# Patient Record
Sex: Female | Born: 1985 | Race: White | Hispanic: No | Marital: Married | Smoking: Never smoker
Health system: Southern US, Community
[De-identification: ages and names within clinical notes are randomized; demographics above are authoritative.]

---

## 2021-05-01 ENCOUNTER — Emergency Department (HOSPITAL_BASED_OUTPATIENT_CLINIC_OR_DEPARTMENT_OTHER): Payer: BC Managed Care – PPO

## 2021-05-01 ENCOUNTER — Other Ambulatory Visit: Payer: Self-pay

## 2021-05-01 ENCOUNTER — Encounter (HOSPITAL_BASED_OUTPATIENT_CLINIC_OR_DEPARTMENT_OTHER): Payer: Self-pay

## 2021-05-01 ENCOUNTER — Emergency Department (HOSPITAL_BASED_OUTPATIENT_CLINIC_OR_DEPARTMENT_OTHER)
Admission: EM | Admit: 2021-05-01 | Discharge: 2021-05-01 | Disposition: A | Discharge status: Home / Self Care | Payer: BC Managed Care – PPO | Attending: Emergency Medicine | Admitting: Emergency Medicine

## 2021-05-01 DIAGNOSIS — R079 Chest pain, unspecified: Secondary | ICD-10-CM

## 2021-05-01 DIAGNOSIS — R072 Precordial pain: Secondary | ICD-10-CM | POA: Diagnosis present

## 2021-05-01 DIAGNOSIS — R011 Cardiac murmur, unspecified: Secondary | ICD-10-CM | POA: Insufficient documentation

## 2021-05-01 DIAGNOSIS — R200 Anesthesia of skin: Secondary | ICD-10-CM | POA: Diagnosis not present

## 2021-05-01 LAB — CBC
HCT: 43.4 % (ref 36.0–46.0)
Hemoglobin: 15.1 g/dL — ABNORMAL HIGH (ref 12.0–15.0)
MCH: 29.3 pg (ref 26.0–34.0)
MCHC: 34.8 g/dL (ref 30.0–36.0)
MCV: 84.3 fL (ref 80.0–100.0)
Platelets: 246 10*3/uL (ref 150–400)
RBC: 5.15 MIL/uL — ABNORMAL HIGH (ref 3.87–5.11)
RDW: 12.4 % (ref 11.5–15.5)
WBC: 4.9 10*3/uL (ref 4.0–10.5)
nRBC: 0 % (ref 0.0–0.2)

## 2021-05-01 LAB — TROPONIN I (HIGH SENSITIVITY)
Troponin I (High Sensitivity): <2 ng/L (ref <18)
Troponin I (High Sensitivity): <2 ng/L (ref <18)

## 2021-05-01 LAB — BASIC METABOLIC PANEL
Anion gap: 10 (ref 5–15)
BUN: 10 mg/dL (ref 6–20)
CO2: 24 mmol/L (ref 22–32)
Calcium: 9.4 mg/dL (ref 8.9–10.3)
Chloride: 105 mmol/L (ref 98–111)
Creatinine, Ser: 0.73 mg/dL (ref 0.44–1.00)
GFR, Estimated: >60 mL/min (ref >60)
Glucose, Bld: 84 mg/dL (ref 70–99)
Potassium: 3.7 mmol/L (ref 3.5–5.1)
Sodium: 139 mmol/L (ref 135–145)

## 2021-05-01 LAB — PREGNANCY, URINE: Preg Test, Ur: NEGATIVE

## 2021-05-01 NOTE — ED Triage Notes (Signed)
Pt arrives ambulatory to ED with c/o CP starting yesterday with radiation into her back reports she also felt that she was having some numbness in her face. States that she took Tums yesterday and the CP got better but she feels that her face is swollen. Also reports playing golf for the first time over the weekend and feeling that she may have pulled a muscle.  ?

## 2021-05-01 NOTE — Discharge Instructions (Addendum)
Would like for you to follow-up with cardiology.  You should receive a phone call from them in the coming days to set up an appointment.  Please return to the emergency department for any worsening symptoms. ?

## 2021-05-01 NOTE — ED Provider Notes (Signed)
?[facility] EMERGENCY DEPARTMENT ?Provider Note ? ? ?CSN: [id number] ?Arrival date & time: 05/01/21  1203 ? ?  ? ?History ?Chief Complaint  ?Patient presents with  ? Chest Pain  ? ? ?Evelyn Ross is a 36 y.o. female with no significant past medical history who presents to the emergency department today with substernal chest tightness that started yesterday.  Patient states she played golf for the first time ever Saturday and Sunday and had some soreness over the right scapular area.  She states that yesterday she was having pain that radiated from the central chest which she described as a squeezing sensation to the right shoulder where she was sore.  Then began radiating up into the neck.  She took 2 Tums and drink some water and it completely resolved.  Since then she has been complaining that her face is slightly swollen on the right side.  She also had some numbness during the episode which has since resolved. ? ? ?Chest Pain ? ?  ? ?Home Medications ?Prior to Admission medications   ?Not on File  ?   ? ?Allergies    ?Patient has no known allergies.   ? ?Review of Systems   ?Review of Systems  ?Cardiovascular:  Positive for chest pain.  ?All other systems reviewed and are negative. ? ?Physical Exam ?Updated Vital Signs ?BP 131/80   Pulse 66   Temp 98.4 ?F (36.9 ?C) (Oral)   Resp 17   Ht 5\' 2"  (1.575 m)   Wt 64.9 kg   LMP 05/01/2021   SpO2 100%   BMI 26.16 kg/m?  ?Physical Exam ?Vitals and nursing note reviewed.  ?Constitutional:   ?   General: She is not in acute distress. ?   Appearance: Normal appearance.  ?HENT:  ?   Head: Normocephalic and atraumatic.  ?Eyes:  ?   General:     ?   Right eye: No discharge.     ?   Left eye: No discharge.  ?Cardiovascular:  ?   Heart sounds: Murmur heard.  ?   Comments: Regular rate and rhythm.  S1/S2 are distinct without any evidence of murmur, rubs, or gallops.  Radial pulses are 2+ bilaterally.  Dorsalis pedis pulses are 2+ bilaterally.  No evidence  of pedal edema. ?Pulmonary:  ?   Comments: Clear to auscultation bilaterally.  Normal effort.  No respiratory distress.  No evidence of wheezes, rales, or rhonchi heard throughout. ?Chest:  ?   Comments: No palpation to the anterior chest wall.  There is point tenderness to palpation over the right lateral scapula. ?Abdominal:  ?   General: Abdomen is flat. Bowel sounds are normal. There is no distension.  ?   Tenderness: There is no abdominal tenderness. There is no guarding or rebound.  ?Musculoskeletal:     ?   General: Normal range of motion.  ?   Cervical back: Neck supple.  ?Skin: ?   General: Skin is warm and dry.  ?   Findings: No rash.  ?Neurological:  ?   General: No focal deficit present.  ?   Mental Status: She is alert.  ?Psychiatric:     ?   Mood and Affect: Mood normal.     ?   Behavior: Behavior normal.  ? ? ?ED Results / Procedures / Treatments   ?Labs ?(all labs ordered are listed, but only abnormal results are displayed) ?Labs Reviewed  ?CBC - Abnormal; Notable for the following components:  ?  Result Value  ? RBC 5.15 (*)   ? Hemoglobin 15.1 (*)   ? All other components within normal limits  ?BASIC METABOLIC PANEL  ?PREGNANCY, URINE  ?TROPONIN I (HIGH SENSITIVITY)  ?TROPONIN I (HIGH SENSITIVITY)  ? ? ?EKG ?EKG Interpretation ? ?Date/Time:  Tuesday May 01 2021 12:15:12 EDT ?Ventricular Rate:  74 ?PR Interval:  134 ?QRS Duration: 74 ?QT Interval:  360 ?QTC Calculation: 399 ?R Axis:   73 ?Text Interpretation: Normal sinus rhythm Anterior and inferior T wave changes Abnormal ECG No previous ECGs available Confirmed by Alona Bene [phone]) on 05/01/2021 3:14:49 PM ? ?Radiology ?DG Chest 2 View ? ?Result Date: 05/01/2021 ?CLINICAL DATA:  Chest pain EXAM: CHEST - 2 VIEW COMPARISON:  None. FINDINGS: The heart size and mediastinal contours are within normal limits. Both lungs are clear. The visualized skeletal structures are unremarkable. IMPRESSION: No acute abnormality of the lungs. Electronically  Signed   By: Jearld Lesch M.D.   On: 05/01/2021 12:43   ? ?Procedures ?Procedures  ? ? ?Medications Ordered in ED ?Medications - No data to display ? ?ED Course/ Medical Decision Making/ A&P ?  ?                        ?Medical Decision Making ?Amount and/or Complexity of Data Reviewed ?Labs: ordered. ?Radiology: ordered. ? ? ?This patient presents to the ED for concern of chest pain, this involves an extensive number of treatment options, and is a complaint that carries with it a high risk of complications and morbidity.  The differential diagnosis includes ACS, atypical chest pain.  I have a low suspicion for dysrhythmia.  Patient has normal vital signs and I have a low suspicion for aortic dissection at this time.  I doubt cardiac tamponade, pneumothorax, pneumonia. ? ? ?Co morbidities that complicate the patient evaluation ? ?None ? ? ?Additional history obtained: ? ?Additional history obtained from nursing note.  No external records to review ? ? ? ?Lab Tests: ? ?I Ordered, and personally interpreted labs.  The pertinent results include: CBC which did not show any signs of anemia or leukocytosis.  Pregnancy was negative.  BMP was normal.  Initial delta troponin were negative. ? ? ?Imaging Studies ordered: ? ?I ordered imaging studies including chest x-ray ?I independently visualized and interpreted imaging which showed no acute abnormalities ?I agree with the radiologist interpretation ? ? ?Cardiac Monitoring: ? ?The patient was maintained on a cardiac monitor.  I personally viewed and interpreted the cardiac monitored which showed an underlying rhythm of: Normal sinus rhythm ? ? ?Medicines ordered and prescription drug management: ? ?None ? ? ?Problem List / ED Course: ? ?Chest pain less likely to be ACS at this time.  Patient does have some abnormal T wave inversions on her EKG with no prior to compare to.  Unclear whether or not this is chronic.  She will likely need to follow-up with cardiology about this  in the next week.  I will place an ambulatory referral.  Notified patient of all lab results.  Strict return precautions were discussed with the patient.  She is safe for discharge. ?Patient has a heart murmur and discussed with the patient she has had this since childhood. ? ? ?Reevaluation: ? ?After the interventions noted above, I reevaluated the patient and found that they have :improved ? ? ?Social Determinants of Health: ? ?None ? ? ?Dispostion: ? ?After consideration of the diagnostic results and the patients response to  treatment, I feel that the patent would benefit from outpatient follow-up with cardiology.  Ambulatory referral placed. ? ?Final Clinical Impression(s) / ED Diagnoses ?Final diagnoses:  ?Chest pain, unspecified type  ? ? ?Rx / DC Orders ?ED Discharge Orders   ? ?      Ordered  ?  Ambulatory referral to Cardiology       ? 05/01/21 1526  ? ?  ?  ? ?  ? ? ?  ?Honor Loh [place], PA-C ?05/01/21 1531 ? ?  ?Maia Plan, MD ?05/04/21 2342 ? ?

## 2021-06-05 ENCOUNTER — Ambulatory Visit: Payer: BC Managed Care – PPO | Admitting: Cardiology

## 2023-04-13 IMAGING — CR DG CHEST 2V
2 series · 2 of 2 positions shown · non-contrast
Comparison: None.

CLINICAL DATA: Chest pain

EXAM:
CHEST - 2 VIEW

[w chest pa]
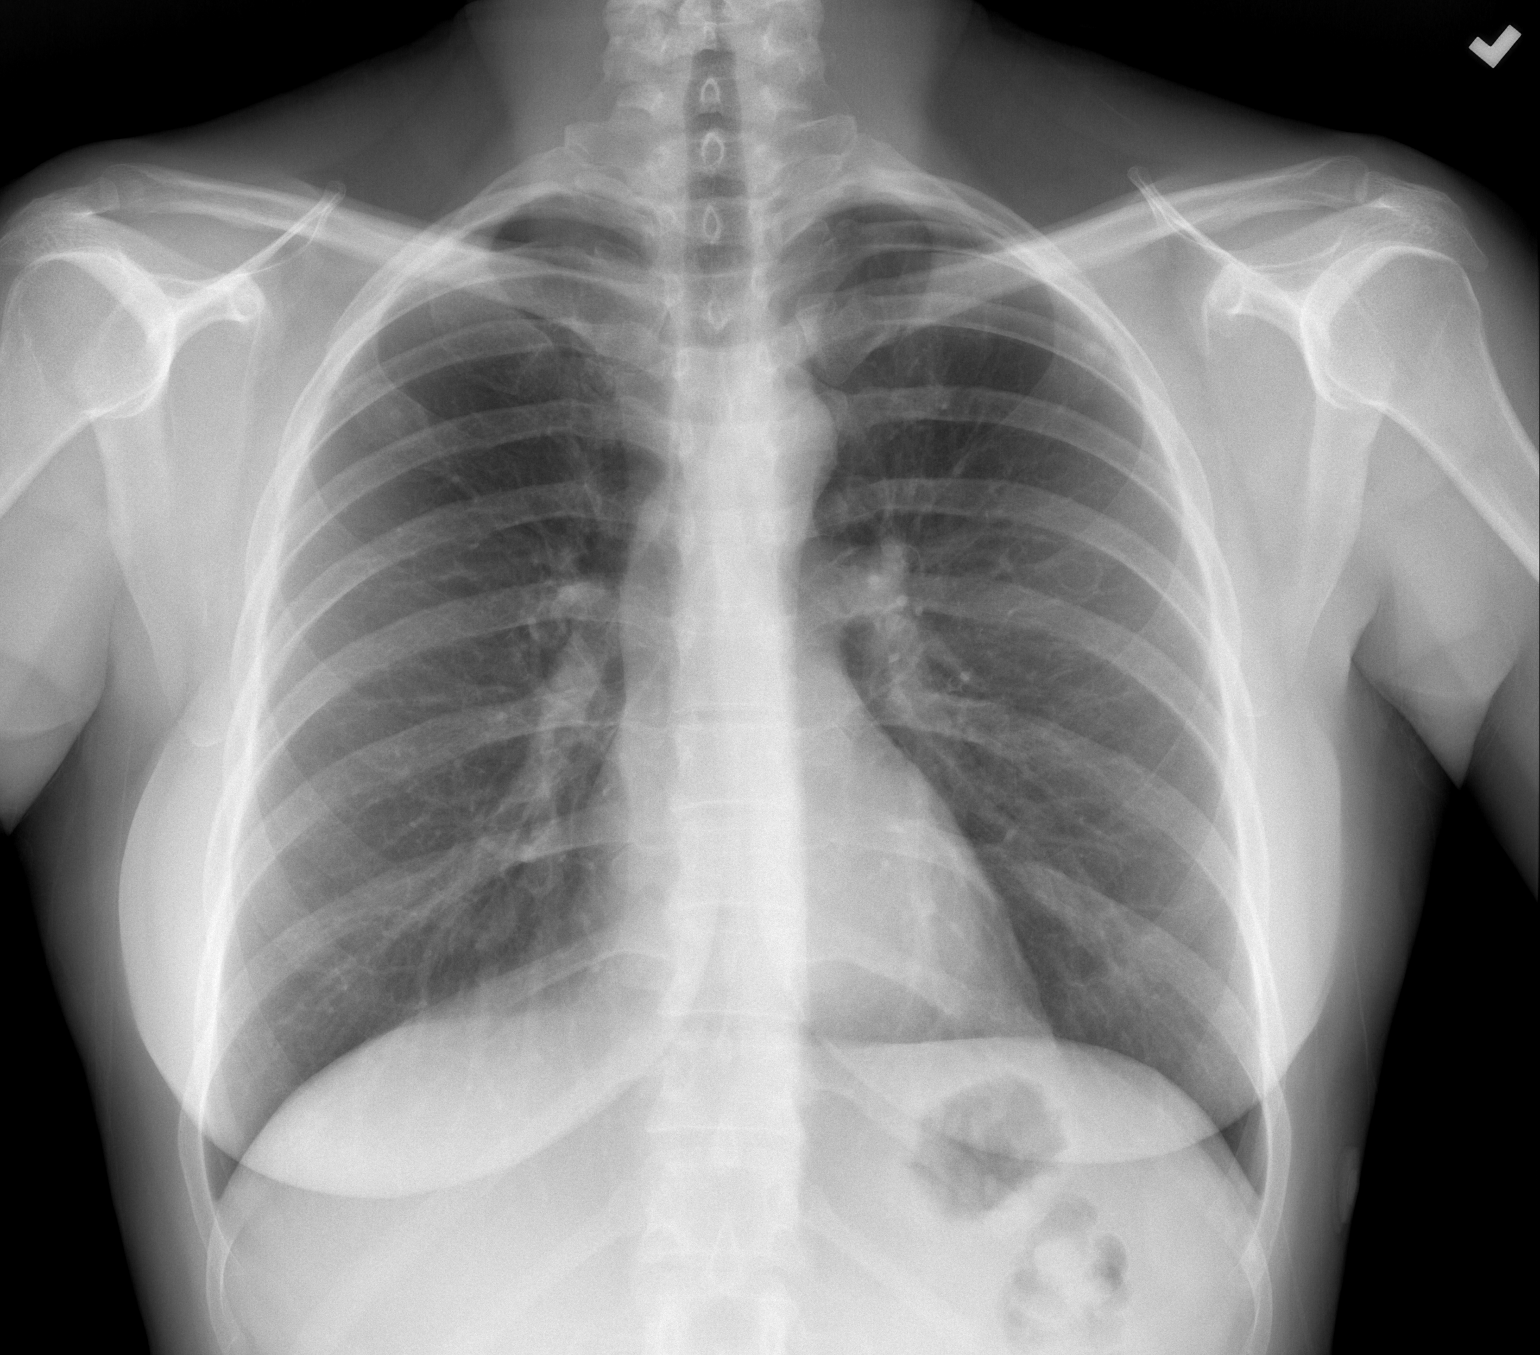

[w chest lat]
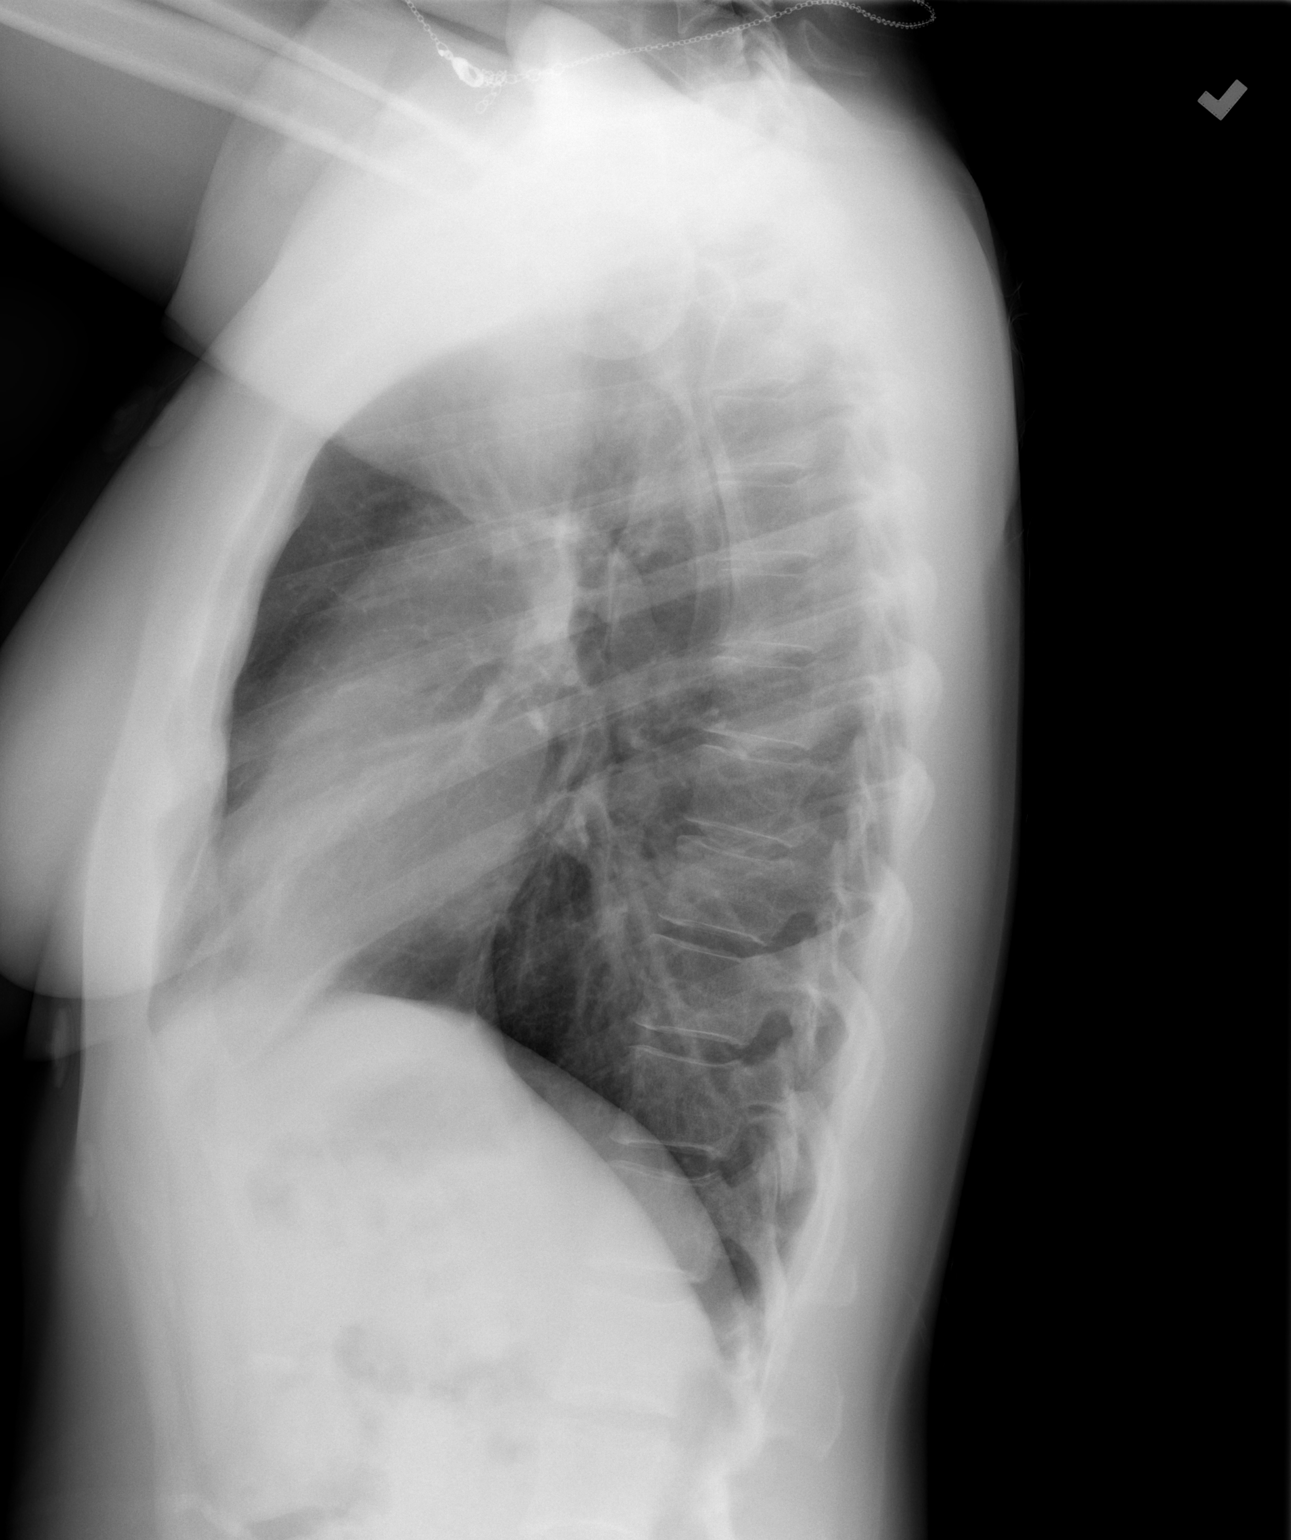

[2 of 2 positions shown; findings below may reference images not displayed]

FINDINGS: The heart size and mediastinal contours are within normal limits.
Both lungs are clear. The visualized skeletal structures are
unremarkable.
IMPRESSION: No acute abnormality of the lungs.
